# Patient Record
Sex: Female | Born: 1975 | Race: White | Hispanic: No | Marital: Married | Smoking: Former smoker
Health system: Southern US, Community
[De-identification: ages and names within clinical notes are randomized; demographics above are authoritative.]

## PROBLEM LIST (undated history)

## (undated) DIAGNOSIS — E785 Hyperlipidemia, unspecified: Secondary | ICD-10-CM

## (undated) DIAGNOSIS — E079 Disorder of thyroid, unspecified: Secondary | ICD-10-CM

## (undated) DIAGNOSIS — I10 Essential (primary) hypertension: Secondary | ICD-10-CM

## (undated) DIAGNOSIS — J45909 Unspecified asthma, uncomplicated: Secondary | ICD-10-CM

## (undated) DIAGNOSIS — J020 Streptococcal pharyngitis: Secondary | ICD-10-CM

## (undated) HISTORY — DX: Hyperlipidemia, unspecified: E78.5

## (undated) HISTORY — PX: TUBAL LIGATION: SHX77

## (undated) HISTORY — PX: TONSILLECTOMY: SUR1361

## (undated) HISTORY — PX: BREAST CYST ASPIRATION: SHX578

---

## 2006-06-30 ENCOUNTER — Ambulatory Visit: Payer: Self-pay | Admitting: Internal Medicine

## 2006-07-07 ENCOUNTER — Ambulatory Visit: Payer: Self-pay | Admitting: Internal Medicine

## 2007-02-18 ENCOUNTER — Ambulatory Visit: Payer: Self-pay | Admitting: Obstetrics and Gynecology

## 2007-02-18 ENCOUNTER — Other Ambulatory Visit: Payer: Self-pay

## 2007-02-26 ENCOUNTER — Ambulatory Visit: Payer: Self-pay | Admitting: Obstetrics and Gynecology

## 2008-05-26 ENCOUNTER — Ambulatory Visit: Payer: Self-pay | Admitting: Internal Medicine

## 2010-02-28 ENCOUNTER — Ambulatory Visit: Payer: Self-pay | Admitting: Internal Medicine

## 2010-03-20 ENCOUNTER — Ambulatory Visit: Payer: Self-pay | Admitting: Internal Medicine

## 2011-04-23 ENCOUNTER — Ambulatory Visit: Payer: Self-pay | Admitting: Internal Medicine

## 2012-04-16 ENCOUNTER — Ambulatory Visit: Payer: Self-pay | Admitting: Internal Medicine

## 2012-04-27 ENCOUNTER — Ambulatory Visit: Payer: Self-pay | Admitting: Internal Medicine

## 2012-07-03 ENCOUNTER — Emergency Department (HOSPITAL_COMMUNITY)
Admission: EM | Admit: 2012-07-03 | Discharge: 2012-07-03 | Disposition: A | Discharge status: Home / Self Care | Payer: 59 | Source: Home / Self Care | Attending: Emergency Medicine | Admitting: Emergency Medicine

## 2012-07-03 ENCOUNTER — Encounter (HOSPITAL_COMMUNITY): Payer: Self-pay | Admitting: *Deleted

## 2012-07-03 DIAGNOSIS — J329 Chronic sinusitis, unspecified: Secondary | ICD-10-CM

## 2012-07-03 HISTORY — DX: Essential (primary) hypertension: I10

## 2012-07-03 HISTORY — DX: Streptococcal pharyngitis: J02.0

## 2012-07-03 HISTORY — DX: Unspecified asthma, uncomplicated: J45.909

## 2012-07-03 HISTORY — DX: Disorder of thyroid, unspecified: E07.9

## 2012-07-03 MED ORDER — FLUCONAZOLE 150 MG PO TABS: ORAL_TABLET | ORAL | Status: AC

## 2012-07-03 MED ORDER — AMOXICILLIN-POT CLAVULANATE 500-125 MG PO TABS: 1.0000 | ORAL_TABLET | Freq: Two times a day (BID) | ORAL | Status: AC

## 2012-07-03 MED ORDER — FEXOFENADINE-PSEUDOEPHED ER 60-120 MG PO TB12: 1.0000 | ORAL_TABLET | Freq: Two times a day (BID) | ORAL | Status: AC

## 2012-07-03 NOTE — ED Notes (Signed)
C/O left sinus pain x 1.5 wks with general malaise; was out of work x 2 days.  C/O continued HA - left side > right side x 5-6 days, and now has sore throat.  Felt feverish with chills this past week.  Has been taking decongestants, Aleve with some minimal relief, and using Neti Pot.

## 2012-07-03 NOTE — ED Provider Notes (Signed)
History     CSN: [id number]  Arrival date & time 07/03/12  1129   First MD Initiated Contact with Patient 07/03/12 1140      Chief Complaint  Patient presents with  . Sore Throat    (Consider location/radiation/quality/duration/timing/severity/associated sxs/prior treatment) HPI Comments: Patient presents urgent care complaining of ongoing left sinus pain and congestion for about a week, runny nose sensation of ear occupation. No nausea vomiting or fevers she's also been complaining or expressing a sore throat as well with some fevers and chills at the beginning of last week. Patient denies any other symptoms such as visual changes, tinnitus, vertigo or paresthesias of her face or upper or lower extremities. She has been trying with some over-the-counter medicine and takes an anti-allergenic medicine chronically.  The history is provided by the patient.    Past Medical History  Diagnosis Date  . Strep pharyngitis   . Thyroid disease     Hypothyroidism  . Hypertension   . Asthma     Past Surgical History  Procedure Date  . Tonsillectomy   . Tubal ligation     No family history on file.  History  Substance Use Topics  . Smoking status: Former Smoker    Quit date: 06/03/2012  . Smokeless tobacco: Not on file  . Alcohol Use: Yes    OB History    Grav Para Term Preterm Abortions TAB SAB Ect Mult Living                  Review of Systems  Constitutional: Negative for activity change and appetite change.  HENT: Positive for congestion, rhinorrhea, postnasal drip and sinus pressure. Negative for sore throat, drooling, mouth sores, trouble swallowing, neck pain, neck stiffness, dental problem and voice change.   Eyes: Negative for pain, discharge, itching and visual disturbance.  Respiratory: Negative for cough and shortness of breath.   Cardiovascular: Negative for chest pain.  Skin: Negative for color change and rash.  Neurological: Negative for dizziness and  weakness.    Allergies  Review of patient's allergies indicates no known allergies.  Home Medications   Current Outpatient Rx  Name Route Sig Dispense Refill  . ALBUTEROL IN Inhalation Inhale into the lungs as needed.    . ASPIRIN 81 MG PO TABS Oral Take 81 mg by mouth daily.    Marland Kitchen FLONASE NA Nasal Place into the nose.    Marland Kitchen HYDROCHLOROTHIAZIDE PO Oral Take by mouth 2 (two) times daily.    Marland Kitchen XYZAL PO Oral Take by mouth.    Marland Kitchen LEVOTHYROXINE SODIUM 50 MCG PO TABS Oral Take 50 mcg by mouth daily.    . TOPROL XL PO Oral Take by mouth 2 (two) times daily.    Marland Kitchen POTASSIUM CHLORIDE PO Oral Take by mouth 2 (two) times daily.    . AMBIEN PO Oral Take by mouth.    . AMOXICILLIN-POT CLAVULANATE 500-125 MG PO TABS Oral Take 1 tablet (500 mg total) by mouth 2 (two) times daily. 20 tablet 0  . FEXOFENADINE-PSEUDOEPHED ER 60-120 MG PO TB12 Oral Take 1 tablet by mouth every 12 (twelve) hours. 14 tablet 0  . FLUCONAZOLE 150 MG PO TABS  1 tab po daily x 3 days 3 tablet 0    BP 146/95  Pulse 67  Temp 98.6 F (37 C) (Oral)  Resp 17  SpO2 98%  LMP 06/16/2012  Physical Exam  Nursing note and vitals reviewed. Constitutional: Vital signs are normal. She appears well-developed and  well-nourished.  HENT:  Head: Normocephalic.  Right Ear: Tympanic membrane normal.  Left Ear: Tympanic membrane normal.  Mouth/Throat: Uvula is midline and mucous membranes are normal. Posterior oropharyngeal erythema present.  Eyes: Conjunctivae are normal.  Neck: Neck supple. No JVD present.  Pulmonary/Chest: Effort normal and breath sounds normal. No accessory muscle usage. Not tachypneic. No respiratory distress. She has no decreased breath sounds. She has no wheezes. She has no rhonchi. She has no rales.  Abdominal: She exhibits no distension. There is no tenderness.  Musculoskeletal: Normal range of motion.  Lymphadenopathy:    She has no cervical adenopathy.  Neurological: She is alert.  Skin: Skin is warm. No  rash noted. No erythema.    ED Course  Procedures (including critical care time)   Labs Reviewed  POCT RAPID STREP A ([facility] ONLY)   No results found.   1. Sinusitis       MDM  Patient symptomatic for approximately 12 days exam and symptoms consistent with sinusitis. Patient was prescribed a course of Allegra D. along with Augmentin. She was afebrile with no focal neurological abnormalities on exam. We discussed symptoms that would warrant followup for further evaluation here or with her primary care Dr.         Jimmie Molly, MD 07/03/12 [phone]

## 2012-12-22 ENCOUNTER — Encounter: Payer: Self-pay | Admitting: Family Medicine

## 2012-12-22 ENCOUNTER — Ambulatory Visit (INDEPENDENT_AMBULATORY_CARE_PROVIDER_SITE_OTHER): Payer: 59 | Admitting: Family Medicine

## 2012-12-22 VITALS — BP 142/99 | HR 72 | Ht 65.0 in | Wt 198.0 lb

## 2012-12-22 DIAGNOSIS — M25519 Pain in unspecified shoulder: Secondary | ICD-10-CM

## 2012-12-22 DIAGNOSIS — M25511 Pain in right shoulder: Secondary | ICD-10-CM | POA: Insufficient documentation

## 2012-12-22 NOTE — Patient Instructions (Addendum)
You have strained your right rotator cuff leading to impingement, tendinopathy. Try to avoid painful activities (overhead activities, lifting with extended arm) as much as possible. Aleve 2 tabs twice a day with food OR ibuprofen 3 tabs three times a day with food for pain and inflammation. Subacromial injection may be beneficial to help with pain and to decrease inflammation - you were given this today. Start physical therapy with transition to home exercise program. Do home exercise program with theraband and scapular stabilization exercises daily - these are very important for long term relief even if an injection was given. If not improving at follow-up we will consider further imaging, nitro patches. Follow up with me in 5-6 weeks.

## 2012-12-22 NOTE — Progress Notes (Signed)
Subjective:    Patient ID: Debbie Kim, female    DOB: May 22, 1976, 37 y.o.   MRN: [id number]  PCP: Bethann Punches  HPI 37 yo F here for right shoulder pain.  Patient reports around 6 weeks ago she was putting up [employer], moving boxes when she believes this started. Also had an episode at that time where she slipped on ice and caught herself with railing with right arm. Pain has slowly worsened over this time. + night pain. Pain worse with reaching. Affecting her motion at times. No swelling or bruising. Is right handed. Taking naproxen. Occasional locking feeling.  Past Medical History  Diagnosis Date  . Strep pharyngitis   . Thyroid disease     Hypothyroidism  . Hypertension   . Asthma     Current Outpatient Prescriptions on File Prior to Visit  Medication Sig Dispense Refill  . ALBUTEROL IN Inhale into the lungs as needed.      . Fluticasone Propionate (FLONASE NA) Place into the nose.      Marland Kitchen HYDROCHLOROTHIAZIDE PO Take by mouth 2 (two) times daily.      . Levocetirizine Dihydrochloride (XYZAL PO) Take by mouth.      . levothyroxine (SYNTHROID) 50 MCG tablet Take 50 mcg by mouth daily.      . Metoprolol Succinate (TOPROL XL PO) Take by mouth 2 (two) times daily.      Marland Kitchen POTASSIUM CHLORIDE PO Take by mouth 2 (two) times daily.      Marland Kitchen sulfaSALAzine (AZULFIDINE) 500 MG tablet Take 500 mg by mouth 4 (four) times daily.      . Zolpidem Tartrate (AMBIEN PO) Take by mouth.      Marland Kitchen aspirin 81 MG tablet Take 81 mg by mouth daily.      . fexofenadine-pseudoephedrine (ALLEGRA-D) 60-120 MG per tablet Take 1 tablet by mouth every 12 (twelve) hours.  14 tablet  0    Past Surgical History  Procedure Date  . Tonsillectomy   . Tubal ligation     No Known Allergies  History   Social History  . Marital Status: Unknown    Spouse Name: N/A    Number of Children: N/A  . Years of Education: N/A   Occupational History  . Not on file.   Social History Main Topics    . Smoking status: Former Smoker    Quit date: 06/03/2012  . Smokeless tobacco: Not on file  . Alcohol Use: Yes  . Drug Use: No  . Sexually Active: Not on file   Other Topics Concern  . Not on file   Social History Narrative  . No narrative on file    Family History  Problem Relation Age of Onset  . Hypertension Mother   . Diabetes Mother   . Hypertension Father   . Hypertension Sister   . Hyperlipidemia Brother   . Heart attack Neg Hx   . Sudden death Neg Hx     BP 142/99  Pulse 72  Ht 5\' 5"  (1.651 m)  Wt 198 lb (89.812 kg)  BMI 32.95 kg/m2  Review of Systems See HPI above.    Objective:   Physical Exam Gen: NAD  R shoulder: No swelling, ecchymoses.  No gross deformity. No TTP AC joint though mild TTP biceps tendon. FROM without painful arc. Positive Hawkins, Neers. Negative Speeds, Yergasons. Strength 5/5 with empty can and resisted internal/external rotation.  Pain with resisted ER. Negative apprehension. NV intact distally.    Assessment &  Plan:  1. Right shoulder pain - 2/2 rotator cuff strain.  Excellent motion and strength reassuring.  Start with formal PT, continue with nsaids.  Subacromial injection given today.  Avoid overhead activities, reaching as much as possible.  F/u in 5-6 weeks for reevaluation.  After informed written consent, patient was seated on exam table. Right shoulder was prepped with alcohol swab and utilizing posterior approach, patient's right subacromial space was injected with 3:1 marcaine: depomedrol. Patient tolerated the procedure well without immediate complications.

## 2012-12-22 NOTE — Assessment & Plan Note (Signed)
2/2 rotator cuff strain.  Excellent motion and strength reassuring.  Start with formal PT, continue with nsaids.  Subacromial injection given today.  Avoid overhead activities, reaching as much as possible.  F/u in 5-6 weeks for reevaluation.  After informed written consent, patient was seated on exam table. Right shoulder was prepped with alcohol swab and utilizing posterior approach, patient's right subacromial space was injected with 3:1 marcaine: depomedrol. Patient tolerated the procedure well without immediate complications.

## 2012-12-27 ENCOUNTER — Ambulatory Visit: Payer: 59 | Admitting: Family Medicine

## 2012-12-29 ENCOUNTER — Ambulatory Visit: Payer: 59 | Attending: Family Medicine | Admitting: Rehabilitative and Restorative Service Providers"

## 2012-12-29 DIAGNOSIS — M25579 Pain in unspecified ankle and joints of unspecified foot: Secondary | ICD-10-CM | POA: Insufficient documentation

## 2012-12-29 DIAGNOSIS — M6281 Muscle weakness (generalized): Secondary | ICD-10-CM | POA: Insufficient documentation

## 2012-12-29 DIAGNOSIS — IMO0001 Reserved for inherently not codable concepts without codable children: Secondary | ICD-10-CM | POA: Insufficient documentation

## 2013-01-03 ENCOUNTER — Ambulatory Visit: Payer: 59 | Admitting: Physical Therapy

## 2013-01-11 ENCOUNTER — Ambulatory Visit: Payer: 59 | Admitting: Rehabilitative and Restorative Service Providers"

## 2013-01-13 ENCOUNTER — Ambulatory Visit: Payer: 59 | Admitting: Physical Therapy

## 2013-02-01 ENCOUNTER — Ambulatory Visit (INDEPENDENT_AMBULATORY_CARE_PROVIDER_SITE_OTHER): Payer: 59 | Admitting: Family Medicine

## 2013-02-01 VITALS — BP 130/94

## 2013-02-01 DIAGNOSIS — M25511 Pain in right shoulder: Secondary | ICD-10-CM

## 2013-02-01 DIAGNOSIS — M25519 Pain in unspecified shoulder: Secondary | ICD-10-CM

## 2013-02-02 ENCOUNTER — Encounter: Payer: Self-pay | Admitting: Family Medicine

## 2013-02-02 NOTE — Progress Notes (Signed)
Subjective:    Patient ID: Debbie Kim, female    DOB: 08-17-1976, 37 y.o.   MRN: [id number]  PCP: Bethann Punches  Shoulder Pain    37 yo F here for f/u right shoulder pain.  1/8: Patient reports around 6 weeks ago she was putting up [employer], moving boxes when she believes this started. Also had an episode at that time where she slipped on ice and caught herself with railing with right arm. Pain has slowly worsened over this time. + night pain. Pain worse with reaching. Affecting her motion at times. No swelling or bruising. Is right handed. Taking naproxen. Occasional locking feeling.  2/18: Patient reports her right shoulder is over 75% better following PT, home exercises, subacromial injection. Gets occasional feeling of pain with arm extended behind her or carrying something heavy with arm extended. No night pain. Not taking any pain medicine currently. Happy with her progress.  Past Medical History  Diagnosis Date  . Strep pharyngitis   . Thyroid disease     Hypothyroidism  . Hypertension   . Asthma     Current Outpatient Prescriptions on File Prior to Visit  Medication Sig Dispense Refill  . ALBUTEROL IN Inhale into the lungs as needed.      Marland Kitchen aspirin 81 MG tablet Take 81 mg by mouth daily.      . fexofenadine-pseudoephedrine (ALLEGRA-D) 60-120 MG per tablet Take 1 tablet by mouth every 12 (twelve) hours.  14 tablet  0  . Fluticasone Propionate (FLONASE NA) Place into the nose.      Marland Kitchen HYDROCHLOROTHIAZIDE PO Take by mouth 2 (two) times daily.      . Levocetirizine Dihydrochloride (XYZAL PO) Take by mouth.      . levothyroxine (SYNTHROID) 50 MCG tablet Take 50 mcg by mouth daily.      . Metoprolol Succinate (TOPROL XL PO) Take by mouth 2 (two) times daily.      Marland Kitchen POTASSIUM CHLORIDE PO Take by mouth 2 (two) times daily.      Marland Kitchen sulfaSALAzine (AZULFIDINE) 500 MG tablet Take 500 mg by mouth 4 (four) times daily.      . Zolpidem Tartrate (AMBIEN PO) Take  by mouth.       No current facility-administered medications on file prior to visit.    Past Surgical History  Procedure Laterality Date  . Tonsillectomy    . Tubal ligation      No Known Allergies  History   Social History  . Marital Status: Unknown    Spouse Name: N/A    Number of Children: N/A  . Years of Education: N/A   Occupational History  . Not on file.   Social History Main Topics  . Smoking status: Former Smoker    Quit date: 06/03/2012  . Smokeless tobacco: Not on file  . Alcohol Use: Yes  . Drug Use: No  . Sexually Active: Not on file   Other Topics Concern  . Not on file   Social History Narrative  . No narrative on file    Family History  Problem Relation Age of Onset  . Hypertension Mother   . Diabetes Mother   . Hypertension Father   . Hypertension Sister   . Hyperlipidemia Brother   . Heart attack Neg Hx   . Sudden death Neg Hx     BP 130/94  Review of Systems  See HPI above.    Objective:   Physical Exam  Gen: NAD  R shoulder:  No swelling, ecchymoses.  No gross deformity. No TTP AC joint though mild TTP biceps tendon. FROM without painful arc. Equivocal hawkins, negative Neers. Negative Speeds, Yergasons. Strength 5/5 with empty can and resisted internal/external rotation.  No pain with resisted ER. Negative apprehension. NV intact distally.    Assessment & Plan:  1. Right shoulder pain - 2/2 rotator cuff strain.  Excellent improvement with injection, PT, and home exercises to date.  Advised to do home exercises now most days of the week for next 6 weeks then can discontinue if she'd completely improved.  F/u in 6 weeks or as needed.  Call with any questions or concerns.

## 2013-02-02 NOTE — Assessment & Plan Note (Signed)
2/2 rotator cuff strain.  Excellent improvement with injection, PT, and home exercises to date.  Advised to do home exercises now most days of the week for next 6 weeks then can discontinue if she'd completely improved.  F/u in 6 weeks or as needed.  Call with any questions or concerns.

## 2013-05-10 ENCOUNTER — Ambulatory Visit: Payer: Self-pay | Admitting: Internal Medicine

## 2013-10-31 ENCOUNTER — Encounter: Payer: 59 | Attending: Internal Medicine | Admitting: Dietician

## 2013-10-31 ENCOUNTER — Encounter: Payer: Self-pay | Admitting: Dietician

## 2013-10-31 VITALS — Ht 66.0 in | Wt 191.6 lb

## 2013-10-31 DIAGNOSIS — I10 Essential (primary) hypertension: Secondary | ICD-10-CM

## 2013-10-31 DIAGNOSIS — E785 Hyperlipidemia, unspecified: Secondary | ICD-10-CM

## 2013-10-31 DIAGNOSIS — E663 Overweight: Secondary | ICD-10-CM

## 2013-10-31 DIAGNOSIS — Z713 Dietary counseling and surveillance: Secondary | ICD-10-CM | POA: Insufficient documentation

## 2013-10-31 NOTE — Progress Notes (Signed)
Medical Nutrition Therapy:  Appt start time: 1000 end time:  1100.  Assessment:  Primary concerns today: Improved weight control. Pt has recently increased synthroid dosage from 50 to 75 to 100 mcg qd. Since increase to 75 mcg a few months ago, she has lost about 10 lbs, per pt self-report. Pt admits to large portions of higher fat foods, minimal physical activity.  Preferred Learning Style:   No preference indicated   Learning Readiness:   Contemplating  MEDICATIONS: see list.   DIETARY INTAKE: Usual eating pattern includes 3 meals and 0 snacks per day. Everyday foods include sandwich in am, microwave meal for lunch.  Avoided foods include game meats, most whole grains.    24-hr recall:  B ( AM): husband makes sausage or bacon, egg, cheese on sandwich (white bread). With diet coke. If still hungry at work, will get a hash brown from cafeteria. Snk ( AM): none  L ( PM): frozen meal (lean cuisine, stouffers, etc.). Diet coke. Snk ( PM): none D ( PM): husband makes dinner. Always includes a protein, starch, and veg. Husband is diabetic, so rare pasta or potatoes. Proteins include beef, chix, pork, pt likes fish but husband rarely buys and prepares it. Starches include mashed potatoes with gravy, white rice, white bread. Veg include green beans, asparagus, salad (large amounts of ranch dressing), occasionally corn or October beans.  Snk ( PM): none Beverages: good amount of water drunk with sugar free flavoring added (about 9-10 per day). Diet soda with breakfast and lunch. No EtOH. Pt states distaste for most sweets, but enjoyment of large portions of fattier meats, cheeses, high fat, salty snacks. Pt also dislikes most if not all whole grain foods.   Usual physical activity: almost none. No active hobbies noted. She used to run, but discontinued due to some knee pain and never began again.   Progress Towards Goal(s):  In progress.   Nutritional Diagnosis:  [place]-3.3 Overweight/obesity As  related to high kcal foods, particularly fatty protein choices, sedentary lifestyle.  As evidenced by BMI>25, pt diet recall.    Intervention:  Nutrition counseling provided strategies for calorie control, increasing physical activity. RD demonstrated the Plate Method for dinner meals, recommended leaner protein options, and low kcal microwave meals for lunch. RD recommended 1400-1500 kcal per day meal plan to be tracked using smart phone, and minimum 30 minutes activity each day. Pt will take breaks from work every 2 hours to take a 10 minutes walk, to total 30 minutes each day. Because pt lacks fish in diet, RD recommended 3 g per day fish oil for reducing BP, LDL, and total cholesterol.   Teaching Method Utilized: Visual Auditory  Handouts given during visit include:  Best Pro, Fat, and CHO foods for heart health  Barriers to learning/adherence to lifestyle change: husband does most of cooking, shopping and reportedly does not alter much based on pt input. Portion control will be essential to effective weight management for this patient.  Demonstrated degree of understanding via:  Teach Back   Monitoring/Evaluation:  Dietary intake, exercise, portion control, and body weight in 2 month(s).

## 2014-01-06 ENCOUNTER — Ambulatory Visit: Payer: 59 | Admitting: Dietician

## 2014-05-02 ENCOUNTER — Ambulatory Visit (INDEPENDENT_AMBULATORY_CARE_PROVIDER_SITE_OTHER): Payer: 59 | Admitting: Family Medicine

## 2014-05-02 ENCOUNTER — Encounter: Payer: Self-pay | Admitting: Family Medicine

## 2014-05-02 ENCOUNTER — Ambulatory Visit (HOSPITAL_BASED_OUTPATIENT_CLINIC_OR_DEPARTMENT_OTHER)
Admission: RE | Admit: 2014-05-02 | Discharge: 2014-05-02 | Disposition: A | Discharge status: Home / Self Care | Payer: 59 | Source: Ambulatory Visit | Attending: Family Medicine | Admitting: Family Medicine

## 2014-05-02 VITALS — BP 143/95 | HR 85 | Ht 66.0 in | Wt 209.0 lb

## 2014-05-02 DIAGNOSIS — S99919A Unspecified injury of unspecified ankle, initial encounter: Principal | ICD-10-CM

## 2014-05-02 DIAGNOSIS — S8992XA Unspecified injury of left lower leg, initial encounter: Secondary | ICD-10-CM

## 2014-05-02 DIAGNOSIS — S8990XA Unspecified injury of unspecified lower leg, initial encounter: Secondary | ICD-10-CM

## 2014-05-02 DIAGNOSIS — S99929A Unspecified injury of unspecified foot, initial encounter: Secondary | ICD-10-CM

## 2014-05-02 DIAGNOSIS — X500XXA Overexertion from strenuous movement or load, initial encounter: Secondary | ICD-10-CM | POA: Insufficient documentation

## 2014-05-02 NOTE — Patient Instructions (Addendum)
I'm concerned you may have torn your medial meniscus vs have a meniscal contusion. Both are treated similarly initially. Icing 15 minutes at a time 3-4 times a day. Knee brace for support when up and walking around. Aleve 2 tabs twice a day with food OR ibuprofen 600mg  three times a day with food for pain and inflammation. Consider physical therapy for quad and hamstring strengthening, meniscus tear protocol. Straight leg raises, knee extensions 3 sets of 10 once a day. Follow up with me in 4 weeks for reevaluation.

## 2014-05-03 ENCOUNTER — Encounter: Payer: Self-pay | Admitting: Family Medicine

## 2014-05-03 DIAGNOSIS — S8992XA Unspecified injury of left lower leg, initial encounter: Secondary | ICD-10-CM | POA: Insufficient documentation

## 2014-05-03 NOTE — Progress Notes (Signed)
Patient ID: Debbie Kim, female   DOB: 01/30/1976, 38 y.o.   MRN: [id number]  PCP: Danella PentonMILLER,MARK F., MD  Subjective:   HPI: Patient is a 38 y.o. female here for left knee injury.  Patient reports 5 days ago on 5/14 she had left foot planted and felt like her left knee rolled. She caught herself but developed pain on medial aspect of knee below the kneecap. Used knee sleeve, icing, elevation. No bruising. Pain currently a 2/10. No catching, locking, giving out.  Past Medical History  Diagnosis Date  . Strep pharyngitis   . Thyroid disease     Hypothyroidism  . Hypertension   . Asthma   . Hyperlipidemia     Current Outpatient Prescriptions on File Prior to Visit  Medication Sig Dispense Refill  . ALBUTEROL IN Inhale into the lungs as needed.      Marland Kitchen. aspirin 81 MG tablet Take 81 mg by mouth daily.      . Fluticasone Propionate (FLONASE NA) Place into the nose.      Marland Kitchen. HYDROCHLOROTHIAZIDE PO Take by mouth 2 (two) times daily.      . Levocetirizine Dihydrochloride (XYZAL PO) Take by mouth.      . levothyroxine (SYNTHROID) 50 MCG tablet Take 100 mcg by mouth daily.       . Metoprolol Succinate (TOPROL XL PO) Take by mouth 2 (two) times daily.      Marland Kitchen. POTASSIUM CHLORIDE PO Take by mouth 2 (two) times daily.      Marland Kitchen. sulfaSALAzine (AZULFIDINE) 500 MG tablet Take 1,000 mg by mouth 2 (two) times daily.       . Zolpidem Tartrate (AMBIEN PO) Take by mouth.       No current facility-administered medications on file prior to visit.    Past Surgical History  Procedure Laterality Date  . Tonsillectomy    . Tubal ligation      No Known Allergies  History   Social History  . Marital Status: Unknown    Spouse Name: N/A    Number of Children: N/A  . Years of Education: N/A   Occupational History  . Not on file.   Social History Main Topics  . Smoking status: Former Smoker    Quit date: 06/03/2012  . Smokeless tobacco: Never Used  . Alcohol Use: Yes  . Drug Use: No  . Sexual  Activity: Not on file   Other Topics Concern  . Not on file   Social History Narrative  . No narrative on file    Family History  Problem Relation Age of Onset  . Hypertension Mother   . Diabetes Mother   . Hypertension Father   . Hypertension Sister   . Hyperlipidemia Brother   . Heart attack Neg Hx   . Sudden death Neg Hx     BP 143/95  Pulse 85  Ht 5\' 6"  (1.676 m)  Wt 209 lb (94.802 kg)  BMI 33.75 kg/m2  LMP 04/14/2014  Review of Systems: See HPI above.    Objective:  Physical Exam:  Gen: NAD  Left knee: No gross deformity, ecchymoses, swelling. Medial joint line TTP.  No lateral joint line, other TTP. FROM. Negative ant/post drawers. Negative valgus/varus testing. Negative lachmanns. Positive mcmurrays, apleys, thessalys.  Negative patellar apprehension. NV intact distally.    Assessment & Plan:  1. Left knee injury - concerning for medial meniscal tear vs contusion.  Start with conservative treatment as she's not having mechanical symptoms.  Icing, brace,  nsaids, home strenghtening exercises.  Consider PT, imaging if not improving.

## 2014-05-03 NOTE — Assessment & Plan Note (Signed)
concerning for medial meniscal tear vs contusion.  Start with conservative treatment as she's not having mechanical symptoms.  Icing, brace, nsaids, home strenghtening exercises.  Consider PT, imaging if not improving.

## 2014-05-30 ENCOUNTER — Encounter: Payer: Self-pay | Admitting: Family Medicine

## 2014-05-30 ENCOUNTER — Ambulatory Visit (INDEPENDENT_AMBULATORY_CARE_PROVIDER_SITE_OTHER): Payer: 59 | Admitting: Family Medicine

## 2014-05-30 VITALS — BP 130/88 | HR 82 | Ht 66.0 in | Wt 210.0 lb

## 2014-05-30 DIAGNOSIS — S99919A Unspecified injury of unspecified ankle, initial encounter: Secondary | ICD-10-CM

## 2014-05-30 DIAGNOSIS — S99929A Unspecified injury of unspecified foot, initial encounter: Secondary | ICD-10-CM

## 2014-05-30 DIAGNOSIS — S8990XA Unspecified injury of unspecified lower leg, initial encounter: Secondary | ICD-10-CM

## 2014-05-30 DIAGNOSIS — S8992XA Unspecified injury of left lower leg, initial encounter: Secondary | ICD-10-CM

## 2014-05-30 MED ORDER — METHYLPREDNISOLONE ACETATE 40 MG/ML IJ SUSP
40.0000 mg | Freq: Once | INTRAMUSCULAR | Status: AC
  Administered 2014-05-30: 40 mg via INTRA_ARTICULAR

## 2014-05-30 NOTE — Patient Instructions (Signed)
Call me if over 1-2 weeks you're still not improving as expected. If that's the case, would consider an MRI. No restrictions on activities after this shot.

## 2014-05-31 ENCOUNTER — Encounter: Payer: Self-pay | Admitting: Family Medicine

## 2014-05-31 ENCOUNTER — Ambulatory Visit: Payer: Self-pay | Admitting: Internal Medicine

## 2014-05-31 NOTE — Progress Notes (Signed)
Patient ID: Debbie Kim, female   DOB: 11/27/1976, 38 y.o.   MRN: [id number]  PCP: Danella PentonMILLER,MARK F., MD  Subjective:   HPI: Patient is a 38 y.o. female here for left knee injury.  5/19: Patient reports 5 days ago on 5/14 she had left foot planted and felt like her left knee rolled. She caught herself but developed pain on medial aspect of knee below the kneecap. Used knee sleeve, icing, elevation. No bruising. Pain currently a 2/10. No catching, locking, giving out.  6/16: Patient reports left knee feels worse instead of improved. Pain now a 4/10. Swelling has improved. Did more walking than usual last week in [place]. Icing, using sleeve, elevating. No catching, locking, giving out.  Past Medical History  Diagnosis Date  . Strep pharyngitis   . Thyroid disease     Hypothyroidism  . Hypertension   . Asthma   . Hyperlipidemia     Current Outpatient Prescriptions on File Prior to Visit  Medication Sig Dispense Refill  . ALBUTEROL IN Inhale into the lungs as needed.      Marland Kitchen. aspirin 81 MG tablet Take 81 mg by mouth daily.      . Fluticasone Propionate (FLONASE NA) Place into the nose.      Marland Kitchen. HYDROCHLOROTHIAZIDE PO Take by mouth 2 (two) times daily.      . Levocetirizine Dihydrochloride (XYZAL PO) Take by mouth.      . levothyroxine (SYNTHROID) 50 MCG tablet Take 100 mcg by mouth daily.       . Metoprolol Succinate (TOPROL XL PO) Take by mouth 2 (two) times daily.      Marland Kitchen. POTASSIUM CHLORIDE PO Take by mouth 2 (two) times daily.      Marland Kitchen. sulfaSALAzine (AZULFIDINE) 500 MG tablet Take 1,000 mg by mouth 2 (two) times daily.       . Zolpidem Tartrate (AMBIEN PO) Take by mouth.       No current facility-administered medications on file prior to visit.    Past Surgical History  Procedure Laterality Date  . Tonsillectomy    . Tubal ligation      No Known Allergies  History   Social History  . Marital Status: Unknown    Spouse Name: N/A    Number of Children: N/A  .  Years of Education: N/A   Occupational History  . Not on file.   Social History Main Topics  . Smoking status: Former Smoker    Quit date: 06/03/2012  . Smokeless tobacco: Never Used  . Alcohol Use: Yes  . Drug Use: No  . Sexual Activity: Not on file   Other Topics Concern  . Not on file   Social History Narrative  . No narrative on file    Family History  Problem Relation Age of Onset  . Hypertension Mother   . Diabetes Mother   . Hypertension Father   . Hypertension Sister   . Hyperlipidemia Brother   . Heart attack Neg Hx   . Sudden death Neg Hx     BP 130/88  Pulse 82  Ht 5\' 6"  (1.676 m)  Wt 210 lb (95.255 kg)  BMI 33.91 kg/m2  LMP 04/14/2014  Review of Systems: See HPI above.    Objective:  Physical Exam:  Gen: NAD  Left knee: No gross deformity, ecchymoses, swelling. Medial joint line TTP.  No lateral joint line, other TTP. FROM. Negative ant/post drawers. Negative valgus/varus testing. Negative lachmanns. Positive mcmurrays, apleys.  Negative patellar apprehension.  NV intact distally.    Assessment & Plan:  1. Left knee injury - concerning for medial meniscal tear.  Not improving following last visit with conservative care (HEP, icing, sleeve, nsaids).  Discussed options and will go ahead with injection today.  Discussed considering PT, further imaging as next steps (likely the latter) if not improving over next couple weeks.  After informed written consent, patient was seated on exam table. Left knee was prepped with alcohol swab and utilizing anteromedial approach, patient's left knee was injected intraarticularly with 3:1 marcaine: depomedrol. Patient tolerated the procedure well without immediate complications.

## 2014-05-31 NOTE — Assessment & Plan Note (Signed)
concerning for medial meniscal tear.  Not improving following last visit with conservative care (HEP, icing, sleeve, nsaids).  Discussed options and will go ahead with injection today.  Discussed considering PT, further imaging as next steps (likely the latter) if not improving over next couple weeks.  After informed written consent, patient was seated on exam table. Left knee was prepped with alcohol swab and utilizing anteromedial approach, patient's left knee was injected intraarticularly with 3:1 marcaine: depomedrol. Patient tolerated the procedure well without immediate complications.

## 2014-11-14 ENCOUNTER — Encounter: Payer: 59 | Attending: Internal Medicine | Admitting: *Deleted

## 2014-11-14 ENCOUNTER — Encounter: Payer: Self-pay | Admitting: *Deleted

## 2014-11-14 DIAGNOSIS — E663 Overweight: Secondary | ICD-10-CM | POA: Diagnosis present

## 2014-11-14 DIAGNOSIS — Z6833 Body mass index (BMI) 33.0-33.9, adult: Secondary | ICD-10-CM | POA: Insufficient documentation

## 2014-11-14 DIAGNOSIS — Z713 Dietary counseling and surveillance: Secondary | ICD-10-CM | POA: Diagnosis not present

## 2014-11-14 NOTE — Progress Notes (Signed)
Medical Nutrition Therapy:  Appt start time: 0900 end time:  0945.   Assessment:  Primary concerns today: Debbie Kim is here for nutrition counseling.  She is a [employer] and referred herself a year ago.  She met with Kevan Mellendick, RD, at that time, but she admits she wasn't ready to make any changes at that point.  She has referred herself back to our department and she is already in the process of making lifestyle changes:  Stopped drinking alcohol altogether.  She is trying to focus more on what she's eating and has tried to switch from high fat foods (meats, nuts, cheese) and eat 5 smaller meals and increase vegetables. She feels like she has gained weight.  Husband has diabetes and is home-bound.  They eat out less and Debbie Kim cooks more at home.  She is conflicted on how to cook for his needs verses his needs (she wants higher carb foods and less protein.  She has cut back on prepackaged foods.   Husband went to classes at [facility] several years ago for diabetes education, but he has forgotten most of it.  Debbie Kim hasn't had formal eduction. Usually eats in front of the tv in the den.  She thinks she is a very fast eater.    Preferred Learning Style:   No preference indicated   Learning Readiness:   Change in progress   MEDICATIONS: see list   DIETARY INTAKE:  Usual eating pattern includes 3 meals and 0 snacks per day.  Everyday foods include proteins, starches, fruits, and vegetables.  Avoided foods include none.    24-hr recall:  B ( AM): bacon, egg, and cheese sandwich; bagel with cream cheese and lox.  Might eat at desk at work  Snk ( AM): none  L ( PM): might skip on weekends.   Eats at [facility] hospital cafeteria:: 2 vegetable and meat; might bring chicken salad sandwich from home with fruit and small serving of chips Snk ( PM): none D ( PM): protein, 2 non-starchy vegetables, and 1 starch Snk ( PM): none Beverages: diet coke until noon (3), water, diet  gingerale  Usual physical activity: has bad knees, but tries to do yoga, walking, stretching 15-45 minutes 4-5 times/week  Estimated energy needs: 1800 calories 225 g carbohydrates 90 g protein 60 g fat    Nutritional Diagnosis:  NB-1.5 Disordered eating pattern As related to mindelss eating.  As evidenced by dietary recall.    Intervention:  Nutrition counseling provided.  Encouraged patient to reject traditional diet mentality of "good" vs "bad" foods.  There are no good and bad foods, but rather food is fuel that we needs for our bodies.  When we don't get enough fuel, our bodies suffer the metabolic consequences.  Encouraged patient to honor their body's internal hunger and fullness cues.  Throughout the day, check in mentally and rate hunger.  Try not to eat when ravenous, but instead when slightly hungry.  Then choose food(s) that will be satisfying regardless of nutritional content.  Sit down to enjoy those foods.  Minimize distractions: turn off tv, put away books, work, Brewing technologist.  Make the meal last at least 20 minutes in order to give time to experience and register satiety.  Stop eating when full regardless of how much food is left on the plate.  Get more if still hungry.  The key is to honor fullness so throughout the meal, rate fullness factor and stop when comfortably full, but not stuffed.  Reminded patient that they can have any food they want, whenever they want, and however much they want.  Eventually the novelty will wear out and each food will be equal in terms of its emotional appeal.  This will be a learning process and some days more food will be eaten, some days less.  The key is to honor hunger and fullness without any feelings of guilt.  Pay attention to what the internal cues are, rather than any external factors.  Also recommended exercise for its help benefits not for weight loss.  Focus on how it makes your feel afterwards and how symptoms improve, not weight loss.  Do  exercises that are fun, not torture.  Discussed MyPlate recommendations for diabetes meal planning for her husband and herself at home.  She realizes what she is doing is not that far off   Teaching Method Utilized: * Visual Auditory   Handouts given during visit include:  myplate for diabetes  Meal plan card  Barriers to learning/adherence to lifestyle change: none  Demonstrated degree of understanding via:  Teach Back   Monitoring/Evaluation:  Dietary intake, exercise, and body weight prn.

## 2015-05-02 ENCOUNTER — Other Ambulatory Visit: Payer: Self-pay | Admitting: Internal Medicine

## 2015-05-02 DIAGNOSIS — Z1231 Encounter for screening mammogram for malignant neoplasm of breast: Secondary | ICD-10-CM

## 2015-05-11 ENCOUNTER — Other Ambulatory Visit: Payer: Self-pay | Admitting: Internal Medicine

## 2015-05-11 ENCOUNTER — Ambulatory Visit
Admission: RE | Admit: 2015-05-11 | Discharge: 2015-05-11 | Disposition: A | Discharge status: Home / Self Care | Payer: 59 | Source: Ambulatory Visit | Attending: Internal Medicine | Admitting: Internal Medicine

## 2015-05-11 DIAGNOSIS — Z1231 Encounter for screening mammogram for malignant neoplasm of breast: Secondary | ICD-10-CM | POA: Insufficient documentation

## 2016-05-05 ENCOUNTER — Other Ambulatory Visit: Payer: Self-pay | Admitting: Internal Medicine

## 2016-05-05 DIAGNOSIS — Z1231 Encounter for screening mammogram for malignant neoplasm of breast: Secondary | ICD-10-CM

## 2016-05-26 ENCOUNTER — Ambulatory Visit
Admission: RE | Admit: 2016-05-26 | Discharge: 2016-05-26 | Disposition: A | Discharge status: Home / Self Care | Payer: BLUE CROSS/BLUE SHIELD | Source: Ambulatory Visit | Attending: Internal Medicine | Admitting: Internal Medicine

## 2016-05-26 ENCOUNTER — Other Ambulatory Visit: Payer: Self-pay | Admitting: Internal Medicine

## 2016-05-26 DIAGNOSIS — Z1231 Encounter for screening mammogram for malignant neoplasm of breast: Secondary | ICD-10-CM | POA: Diagnosis not present

## 2017-04-23 ENCOUNTER — Other Ambulatory Visit (HOSPITAL_COMMUNITY): Payer: Self-pay | Admitting: Internal Medicine

## 2017-04-23 ENCOUNTER — Other Ambulatory Visit: Payer: Self-pay | Admitting: Internal Medicine

## 2017-04-23 DIAGNOSIS — Z1231 Encounter for screening mammogram for malignant neoplasm of breast: Secondary | ICD-10-CM

## 2017-05-27 ENCOUNTER — Ambulatory Visit
Admission: RE | Admit: 2017-05-27 | Discharge: 2017-05-27 | Disposition: A | Discharge status: Home / Self Care | Payer: BLUE CROSS/BLUE SHIELD | Source: Ambulatory Visit | Attending: Internal Medicine | Admitting: Internal Medicine

## 2017-05-27 DIAGNOSIS — Z1231 Encounter for screening mammogram for malignant neoplasm of breast: Secondary | ICD-10-CM | POA: Diagnosis present

## 2017-05-29 ENCOUNTER — Other Ambulatory Visit: Payer: Self-pay | Admitting: Internal Medicine

## 2017-05-29 DIAGNOSIS — N6489 Other specified disorders of breast: Secondary | ICD-10-CM

## 2017-05-29 DIAGNOSIS — R928 Other abnormal and inconclusive findings on diagnostic imaging of breast: Secondary | ICD-10-CM

## 2017-06-18 ENCOUNTER — Ambulatory Visit
Admission: RE | Admit: 2017-06-18 | Discharge: 2017-06-18 | Disposition: A | Discharge status: Home / Self Care | Payer: BLUE CROSS/BLUE SHIELD | Source: Ambulatory Visit | Attending: Internal Medicine | Admitting: Internal Medicine

## 2017-06-18 DIAGNOSIS — N6489 Other specified disorders of breast: Secondary | ICD-10-CM | POA: Insufficient documentation

## 2017-06-18 DIAGNOSIS — R928 Other abnormal and inconclusive findings on diagnostic imaging of breast: Secondary | ICD-10-CM

## 2017-06-29 ENCOUNTER — Encounter (HOSPITAL_COMMUNITY): Payer: Self-pay | Admitting: Psychiatry

## 2017-06-29 ENCOUNTER — Other Ambulatory Visit (HOSPITAL_COMMUNITY): Payer: BLUE CROSS/BLUE SHIELD | Attending: Psychiatry | Admitting: Psychiatry

## 2017-06-29 DIAGNOSIS — F339 Major depressive disorder, recurrent, unspecified: Secondary | ICD-10-CM | POA: Insufficient documentation

## 2017-06-29 DIAGNOSIS — F329 Major depressive disorder, single episode, unspecified: Secondary | ICD-10-CM

## 2017-06-29 DIAGNOSIS — F411 Generalized anxiety disorder: Secondary | ICD-10-CM | POA: Insufficient documentation

## 2017-06-29 NOTE — Progress Notes (Signed)
Daily Group Progress Note  Program: IOP  06/29/2017    Group Time: 10:30 - 12 PM  Participation Level:  Active  Behavioral Response: Appropriate  Type of Therapy: Process Group  Summary of Progress: Patients had opportunity to process their feelings and behavioral responses to current life situation. Patient came into group as new patient toward last portion; was attentive and showed empathy for others.     Carney Bernatherine C Tayven Renteria, LCSW
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
## 2017-06-30 ENCOUNTER — Other Ambulatory Visit (HOSPITAL_COMMUNITY): Payer: BLUE CROSS/BLUE SHIELD | Admitting: Psychiatry

## 2017-06-30 DIAGNOSIS — F329 Major depressive disorder, single episode, unspecified: Secondary | ICD-10-CM

## 2017-06-30 DIAGNOSIS — F339 Major depressive disorder, recurrent, unspecified: Secondary | ICD-10-CM | POA: Diagnosis not present

## 2017-06-30 NOTE — Progress Notes (Signed)
Psychiatric Initial Adult Assessment   Patient Identification: Maxcine HamCarol A Mullis MRN:  [id number] Date of Evaluation:  06/30/2017 Referral Source: Dr Maryruth BunKapur Chief Complaint: anxiety and depression  Visit Diagnosis: major depression, single episode moderate.  Generalized anxiety disorder.  Alcohol use disorder  History of Present Illness:  Ms Mauri ReadingMullis has been anxious all her life to the point of panic at times.  The drinking was initially a way to decrease anxiety but over time became an addiction to the point of 3 bottles of wine daily.  No drinking for about a year having stopped on her own.  Depression seems secondary to the anxiety because she cannot escape the constant worry and feeling that something is about to happen.  The drinking overall made things worse to the point of having to quit her job, being unable to work her current job since December 2017.  She gets overwhelmed with anxiety just thinking about returning to work.  She is trying to divorce her current husband who is much older than she but he is making it hard even though they have been separated for 2 years and she has moved on to someone else.  Sleep is impaired by the constant worrying at night over nothing serious.  Associated Signs/Symptoms: Depression Symptoms:  depressed mood, insomnia, fatigue, difficulty concentrating, anxiety, (Hypo) Manic Symptoms:  Irritable Mood, Anxiety Symptoms:  Excessive Worry, Psychotic Symptoms:  none PTSD Symptoms: Negative  Past Psychiatric History: recent therapy and ongoing medication treatment  Previous Psychotropic Medications: Yes   Substance Abuse History in the last 12 months:  Yes.    Consequences of Substance Abuse: Family Consequences:  loss of marriage partially due to alcohol  Past Medical History:  Past Medical History:  Diagnosis Date  . Asthma   . Hyperlipidemia   . Hypertension   . Strep pharyngitis   . Thyroid disease    Hypothyroidism    Past Surgical  History:  Procedure Laterality Date  . BREAST CYST ASPIRATION Right    neg  . TONSILLECTOMY    . TUBAL LIGATION      Family Psychiatric History: none of current relevance  Family History:  Family History  Problem Relation Age of Onset  . Hypertension Mother   . Diabetes Mother   . Breast cancer Mother        5340's & 4970's  . Hypertension Father   . Hypertension Sister   . Hyperlipidemia Brother   . Breast cancer Maternal Aunt        40's  . Alcohol abuse Maternal Aunt   . Breast cancer Maternal Grandmother        unsure of age   . Alcohol abuse Maternal Uncle   . Alcohol abuse Maternal Grandfather   . Heart attack Neg Hx   . Sudden death Neg Hx     Social History:   Social History   Social History  . Marital status: Divorced    Spouse name: N/A  . Number of children: N/A  . Years of education: N/A   Social History Main Topics  . Smoking status: Former Smoker    Quit date: 06/03/2012  . Smokeless tobacco: Never Used  . Alcohol use Yes  . Drug use: No  . Sexual activity: Not Currently    Birth control/ protection: Surgical   Other Topics Concern  . Not on file   Social History Narrative  . No narrative on file    Additional Social History: none  Allergies:  No Known  Allergies  Metabolic Disorder Labs: No results found for: HGBA1C, MPG No results found for: PROLACTIN No results found for: CHOL, TRIG, HDL, CHOLHDL, VLDL, LDLCALC   Current Medications: Current Outpatient Prescriptions  Medication Sig Dispense Refill  . ALBUTEROL IN Inhale into the lungs as needed.    Marland Kitchen aspirin 81 MG tablet Take 81 mg by mouth daily.    . Fluticasone Propionate (FLONASE NA) Place into the nose.    Marland Kitchen HYDROCHLOROTHIAZIDE PO Take by mouth 2 (two) times daily.    . Levocetirizine Dihydrochloride (XYZAL PO) Take by mouth.    . levothyroxine (SYNTHROID) 50 MCG tablet Take 100 mcg by mouth daily.     . Metoprolol Succinate (TOPROL XL PO) Take by mouth 2 (two) times daily.     Marland Kitchen POTASSIUM CHLORIDE PO Take by mouth 2 (two) times daily.    Marland Kitchen sulfaSALAzine (AZULFIDINE) 500 MG tablet Take 1,000 mg by mouth 2 (two) times daily.     . Zolpidem Tartrate (AMBIEN PO) Take by mouth.     No current facility-administered medications for this visit.     Neurologic: Headache: no Seizure: Negative Paresthesias:Negative  Musculoskeletal: Strength & Muscle Tone: within normal limits Gait & Station: normal Patient leans: N/A  Psychiatric Specialty Exam: ROS  Last menstrual period 06/11/2017.There is no height or weight on file to calculate BMI.  General Appearance: Well Groomed  Eye Contact:  Good  Speech:  Clear and Coherent  Volume:  Normal  Mood:  Anxious  Affect:  Congruent  Thought Process:  Coherent and Goal Directed  Orientation:  Full (Time, Place, and Person)  Thought Content:  Logical  Suicidal Thoughts:  No  Homicidal Thoughts:  No  Memory:  Immediate;   Good Recent;   Good Remote;   Good  Judgement:  Good  Insight:  Good  Psychomotor Activity:  Normal  Concentration:  Concentration: Good and Attention Span: Good  Recall:  Good  Fund of Knowledge:Good  Language: Good  Akathisia:  Negative  Handed:  Right  AIMS (if indicated):  0  Assets:  Communication Skills Desire for Improvement Financial Resources/Insurance Housing Intimacy Leisure Time Physical Health Resilience Social Support Talents/Skills Transportation Vocational/Educational  ADL's:  Intact  Cognition: WNL  Sleep:  poor    Treatment Plan Summary: Admit to IOP.  Continue duloxetine 90 mg daily.  Add gabapentin 300 mg tid for anxiety  Carolanne Grumbling, MD 7/17/201812:34 PM

## 2017-06-30 NOTE — Progress Notes (Signed)
  Daily Group Progress Note 06/30/2017  Program: IOP  Group Time:  9:00 - 10:30 AM  Participation Level: Active  Behavioral Response: Appropriate, Sharing and Attentive  Type of Therapy:  Process Group  Summary of Progress: Group members were able to process their feelings about current situation and health concerns. Patient was attentive to others and shared how she related to others with support.     Group Time:  10:30 - 12 PM  Participation Level:  Active  Behavioral Response: Appropriate and Sharing  Type of Therapy: Process Group  Summary of Progress:  Group members continued to process their experiences with additional focus on Wheel of Life handout.  Many were able to recognize how their life is currently unbalanced and what ares of life they may choose to put more or less energy into. Patient shared her experience with letting go of difficult relationships, how she reached the decisions and the long term effects.   Carney Bernatherine C Harrill, LCSW

## 2017-07-01 ENCOUNTER — Other Ambulatory Visit (HOSPITAL_COMMUNITY): Payer: BLUE CROSS/BLUE SHIELD | Admitting: *Deleted

## 2017-07-01 DIAGNOSIS — F329 Major depressive disorder, single episode, unspecified: Secondary | ICD-10-CM

## 2017-07-01 DIAGNOSIS — F339 Major depressive disorder, recurrent, unspecified: Secondary | ICD-10-CM | POA: Diagnosis not present

## 2017-07-01 NOTE — Progress Notes (Signed)
  Daily Group Progress Note 07/01/2017  Program: IOP  Group Time: 9 AM - 10:30 AM  Participation Level: Active  Behavioral Response: Appropriate and Sharing  Type of Therapy:  Process Group  Summary of Progress: Patient's processed thoughts and feelings related to a quote about sharing/not sharing joys and pains. Patient participated significantly in discussion and shared experience of allowing friends to know her pain.      Group Time:  10:30 AM - 12 PM  Participation Level:  Active  Behavioral Response: Appropriate and Sharing  Type of Therapy: Psycho-education Group  Summary of Progress: Brief presentation delivered by facilitator on topic of 'self care' and what that might look like for different individuals. Patients had opportunity to share with others their experience with self care. Patient took advantage of unique opportunity to process and restate her self judgement.   Carney Bernatherine C Harrill, LCSW

## 2017-07-02 ENCOUNTER — Other Ambulatory Visit (HOSPITAL_COMMUNITY): Payer: BLUE CROSS/BLUE SHIELD | Admitting: *Deleted

## 2017-07-02 DIAGNOSIS — F329 Major depressive disorder, single episode, unspecified: Secondary | ICD-10-CM

## 2017-07-02 DIAGNOSIS — F339 Major depressive disorder, recurrent, unspecified: Secondary | ICD-10-CM | POA: Diagnosis not present

## 2017-07-02 NOTE — Progress Notes (Signed)
   Daily Group Progress Note 07/02/2017  Program: IOP  Group Time:  9:00 - 11:00 AM  Participation Level: Active  Behavioral Response: Appropriate, Sharing and Attentive  Type of Therapy:  Process Group  Summary of Progress:  Patients shared their feelings related to their current diagnosis, relationship issues and group. Patient processed her reluctance to attend group today and her feelings regarding her professional position.      Group Time:  11:00 AM - 12:00 PM  Participation Level:  Active  Behavioral Response: Appropriate  Type of Therapy: Activity Group  Summary of Progress:  Patients had opportunity to participate in yoga activity which focused on breathing and centering.   Carney Bernatherine C Holleigh Crihfield, LCSW

## 2017-07-03 ENCOUNTER — Other Ambulatory Visit (HOSPITAL_COMMUNITY): Payer: BLUE CROSS/BLUE SHIELD | Admitting: Psychiatry

## 2017-07-03 DIAGNOSIS — F329 Major depressive disorder, single episode, unspecified: Secondary | ICD-10-CM

## 2017-07-03 DIAGNOSIS — F339 Major depressive disorder, recurrent, unspecified: Secondary | ICD-10-CM | POA: Diagnosis not present
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
## 2017-07-06 ENCOUNTER — Encounter: Payer: Self-pay | Admitting: Psychiatry

## 2017-07-06 ENCOUNTER — Other Ambulatory Visit (HOSPITAL_COMMUNITY): Payer: BLUE CROSS/BLUE SHIELD | Admitting: Psychiatry

## 2017-07-06 DIAGNOSIS — F339 Major depressive disorder, recurrent, unspecified: Secondary | ICD-10-CM | POA: Diagnosis not present

## 2017-07-06 DIAGNOSIS — F329 Major depressive disorder, single episode, unspecified: Secondary | ICD-10-CM

## 2017-07-06 NOTE — Progress Notes (Signed)
    Daily Group Progress Note  Program: IOP  Group Time: 9:00-12:00  Participation Level: Active  Behavioral Response: Appropriate  Type of Therapy:  Group Therapy  Summary of Progress: Pt. Presented as talkative, engaged in group process, smiled and provided feedback to other group members appropriately. Pt. Participated in medication education group with [place]. Pt. Shared with the group that work stress is her most significant stressor. Pt. Discussed her concerns that her employer is trying to push her out and fears about retaliation due to mental health leave when she returns to work.      Shaune PollackBrown, Arneta Mahmood B, LPC

## 2017-07-07 ENCOUNTER — Other Ambulatory Visit (HOSPITAL_COMMUNITY): Payer: BLUE CROSS/BLUE SHIELD | Admitting: Psychiatry

## 2017-07-07 DIAGNOSIS — F329 Major depressive disorder, single episode, unspecified: Secondary | ICD-10-CM

## 2017-07-07 DIAGNOSIS — F339 Major depressive disorder, recurrent, unspecified: Secondary | ICD-10-CM | POA: Diagnosis not present

## 2017-07-07 NOTE — Progress Notes (Signed)
    Daily Group Progress Note  Program: IOP  Group Time: 9:00-12:00  Participation Level: Active  Behavioral Response: Appropriate  Type of Therapy:  Group Therapy  Summary of Progress: Pt. Presents as mildly anxious, talkative, engaged in the group process. Pt. Discussed her anxiety about upcoming court dates regarding her divorce. Pt. Discussed her fears of being judged publicly. Pt. Received feedback from the group regarding working through her shame about being about being in recovery from alcohol addiction and making decision to end her marriage. Pt. Participated in grief and loss group with the Chaplain.      Shaune PollackBrown, Jennifer B, LPC

## 2017-07-08 ENCOUNTER — Other Ambulatory Visit (HOSPITAL_COMMUNITY): Payer: BLUE CROSS/BLUE SHIELD

## 2017-07-09 ENCOUNTER — Other Ambulatory Visit (HOSPITAL_COMMUNITY): Payer: BLUE CROSS/BLUE SHIELD

## 2017-07-10 ENCOUNTER — Other Ambulatory Visit (HOSPITAL_COMMUNITY): Payer: BLUE CROSS/BLUE SHIELD

## 2017-07-13 ENCOUNTER — Other Ambulatory Visit (HOSPITAL_COMMUNITY): Payer: BLUE CROSS/BLUE SHIELD | Admitting: Psychiatry

## 2017-07-13 DIAGNOSIS — F329 Major depressive disorder, single episode, unspecified: Secondary | ICD-10-CM

## 2017-07-13 DIAGNOSIS — F339 Major depressive disorder, recurrent, unspecified: Secondary | ICD-10-CM | POA: Diagnosis not present
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
## 2017-07-14 ENCOUNTER — Other Ambulatory Visit (HOSPITAL_COMMUNITY): Payer: BLUE CROSS/BLUE SHIELD | Admitting: Psychiatry

## 2017-07-14 DIAGNOSIS — F329 Major depressive disorder, single episode, unspecified: Secondary | ICD-10-CM

## 2017-07-14 DIAGNOSIS — F339 Major depressive disorder, recurrent, unspecified: Secondary | ICD-10-CM | POA: Diagnosis not present

## 2017-07-14 MED ORDER — GABAPENTIN 600 MG PO TABS: 600.0000 mg | ORAL_TABLET | Freq: Three times a day (TID) | ORAL | 2 refills | Status: AC

## 2017-07-14 NOTE — Patient Instructions (Signed)
D:  Pt successfully completed MH-IOP today.  A:  Follow up with Jaquita Foldsindy Ziller, LPC on 07-16-17 @ 3 pm and Dr. Maryruth BunKapur on 07-24-17 @ 8 a.m.  Encouraged support groups.  Highly recommend the WRAP Group through [facility].  R:  Pt receptive.

## 2017-07-14 NOTE — Progress Notes (Signed)
Patient ID: Debbie Hamarol A Kim, female   DOB: 10/30/1976, 41 y.o.   [id number] [facility]  Patient:  Debbie Kim DOB:  03/26/1976  Date of Admission: 06/30/2017  Date of Discharge: 07/14/2017  Reason for Admission:depression and anxiety  IOP Course:attended and participated.  Depression improved.  Anxiety some better but still prominent  Mental Status at Discharge:no suicidal thinking  Diagnosis: major depression recurrent moderate.  Generalized anxiety disorder  Level of Care:  IOP  Discharge destination:  Has appointments with her psychiatrist and therapist   Comments:  Very capable and seemed to benefit from the group  The patient received suicide prevention pamphlet:  Yes   Carolanne GrumblingGerald Damian Hofstra, MD
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
## 2017-07-14 NOTE — Progress Notes (Signed)
    Daily Group Progress Note  Program: IOP  Group Time: 9:00-12:00  Participation Level: Active  Behavioral Response: Appropriate  Type of Therapy:  Group Therapy  Summary of Progress: Pt. Presented as talkative, engaged in the group process. Pt. Met with the case manager and psychiatrist and prepared for discharge. Pt. Received positive feedback from the group regarding her participation in the group process. Pt. Discussed her history of co-dependence and learning to live in a healthy relationship. Pt. Expressed some trepidation about returning to work, but indicated that she was ready to go back and face the challenge of being there. Pt. Participated in grief and loss group with the Chaplain.     Nancie Neas, LPC

## 2017-07-15 ENCOUNTER — Other Ambulatory Visit (HOSPITAL_COMMUNITY): Payer: BLUE CROSS/BLUE SHIELD

## 2017-07-16 ENCOUNTER — Other Ambulatory Visit (HOSPITAL_COMMUNITY): Payer: BLUE CROSS/BLUE SHIELD

## 2017-07-17 ENCOUNTER — Other Ambulatory Visit (HOSPITAL_COMMUNITY): Payer: BLUE CROSS/BLUE SHIELD

## 2017-07-20 ENCOUNTER — Other Ambulatory Visit (HOSPITAL_COMMUNITY): Payer: BLUE CROSS/BLUE SHIELD

## 2017-07-21 ENCOUNTER — Other Ambulatory Visit (HOSPITAL_COMMUNITY): Payer: BLUE CROSS/BLUE SHIELD

## 2017-07-22 ENCOUNTER — Other Ambulatory Visit (HOSPITAL_COMMUNITY): Payer: BLUE CROSS/BLUE SHIELD

## 2017-07-23 ENCOUNTER — Other Ambulatory Visit (HOSPITAL_COMMUNITY): Payer: BLUE CROSS/BLUE SHIELD

## 2017-07-24 ENCOUNTER — Other Ambulatory Visit (HOSPITAL_COMMUNITY): Payer: BLUE CROSS/BLUE SHIELD

## 2017-07-27 ENCOUNTER — Other Ambulatory Visit (HOSPITAL_COMMUNITY): Payer: BLUE CROSS/BLUE SHIELD

## 2017-07-28 ENCOUNTER — Other Ambulatory Visit (HOSPITAL_COMMUNITY): Payer: BLUE CROSS/BLUE SHIELD

## 2017-07-29 ENCOUNTER — Other Ambulatory Visit (HOSPITAL_COMMUNITY): Payer: BLUE CROSS/BLUE SHIELD

## 2017-07-30 ENCOUNTER — Other Ambulatory Visit (HOSPITAL_COMMUNITY): Payer: BLUE CROSS/BLUE SHIELD

## 2017-07-31 ENCOUNTER — Other Ambulatory Visit (HOSPITAL_COMMUNITY): Payer: BLUE CROSS/BLUE SHIELD

## 2017-08-03 ENCOUNTER — Other Ambulatory Visit (HOSPITAL_COMMUNITY): Payer: BLUE CROSS/BLUE SHIELD

## 2017-08-04 ENCOUNTER — Other Ambulatory Visit (HOSPITAL_COMMUNITY): Payer: BLUE CROSS/BLUE SHIELD

## 2017-08-05 ENCOUNTER — Other Ambulatory Visit (HOSPITAL_COMMUNITY): Payer: BLUE CROSS/BLUE SHIELD

## 2017-08-06 ENCOUNTER — Other Ambulatory Visit (HOSPITAL_COMMUNITY): Payer: BLUE CROSS/BLUE SHIELD

## 2017-08-07 ENCOUNTER — Other Ambulatory Visit (HOSPITAL_COMMUNITY): Payer: BLUE CROSS/BLUE SHIELD

## 2017-08-10 ENCOUNTER — Other Ambulatory Visit (HOSPITAL_COMMUNITY): Payer: BLUE CROSS/BLUE SHIELD

## 2017-08-11 ENCOUNTER — Other Ambulatory Visit (HOSPITAL_COMMUNITY): Payer: BLUE CROSS/BLUE SHIELD

## 2017-08-12 ENCOUNTER — Other Ambulatory Visit (HOSPITAL_COMMUNITY): Payer: BLUE CROSS/BLUE SHIELD

## 2017-08-13 ENCOUNTER — Other Ambulatory Visit (HOSPITAL_COMMUNITY): Payer: BLUE CROSS/BLUE SHIELD

## 2017-08-14 ENCOUNTER — Other Ambulatory Visit (HOSPITAL_COMMUNITY): Payer: BLUE CROSS/BLUE SHIELD

## 2017-08-18 ENCOUNTER — Other Ambulatory Visit (HOSPITAL_COMMUNITY): Payer: BLUE CROSS/BLUE SHIELD

## 2017-08-19 ENCOUNTER — Other Ambulatory Visit (HOSPITAL_COMMUNITY): Payer: BLUE CROSS/BLUE SHIELD

## 2017-08-20 ENCOUNTER — Other Ambulatory Visit (HOSPITAL_COMMUNITY): Payer: BLUE CROSS/BLUE SHIELD

## 2017-08-21 ENCOUNTER — Other Ambulatory Visit (HOSPITAL_COMMUNITY): Payer: BLUE CROSS/BLUE SHIELD

## 2018-08-04 ENCOUNTER — Other Ambulatory Visit: Payer: Self-pay | Admitting: Internal Medicine

## 2018-08-04 DIAGNOSIS — Z1231 Encounter for screening mammogram for malignant neoplasm of breast: Secondary | ICD-10-CM

## 2018-10-01 DIAGNOSIS — Z0000 Encounter for general adult medical examination without abnormal findings: Secondary | ICD-10-CM | POA: Diagnosis not present

## 2018-10-01 DIAGNOSIS — E559 Vitamin D deficiency, unspecified: Secondary | ICD-10-CM | POA: Diagnosis not present

## 2018-10-05 DIAGNOSIS — E039 Hypothyroidism, unspecified: Secondary | ICD-10-CM | POA: Diagnosis not present

## 2018-10-05 DIAGNOSIS — Z0000 Encounter for general adult medical examination without abnormal findings: Secondary | ICD-10-CM | POA: Diagnosis not present

## 2018-10-05 DIAGNOSIS — E538 Deficiency of other specified B group vitamins: Secondary | ICD-10-CM | POA: Diagnosis not present

## 2018-11-19 DIAGNOSIS — Z0000 Encounter for general adult medical examination without abnormal findings: Secondary | ICD-10-CM | POA: Diagnosis not present

## 2019-03-12 IMAGING — MG MM DIGITAL SCREENING BILAT W/ TOMO W/ CAD
8 of 13 series · 8 of 29 positions shown · non-contrast
Comparison: Previous exam(s).

CLINICAL DATA: Screening.

EXAM:
2D DIGITAL SCREENING BILATERAL MAMMOGRAM WITH CAD AND ADJUNCT TOMO

[R MLO synth-2D]
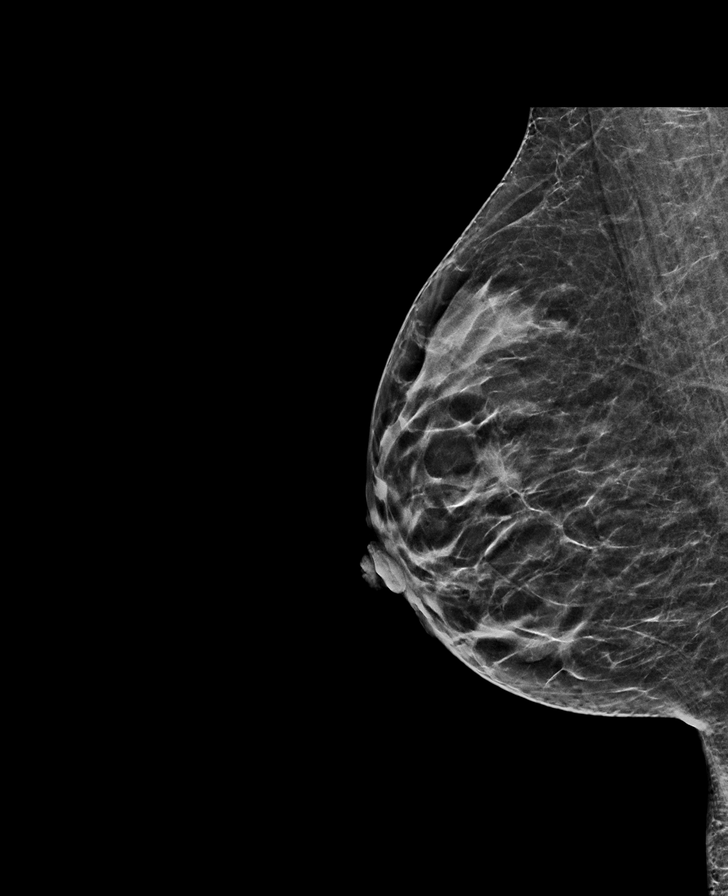

[R CC synth-2D]
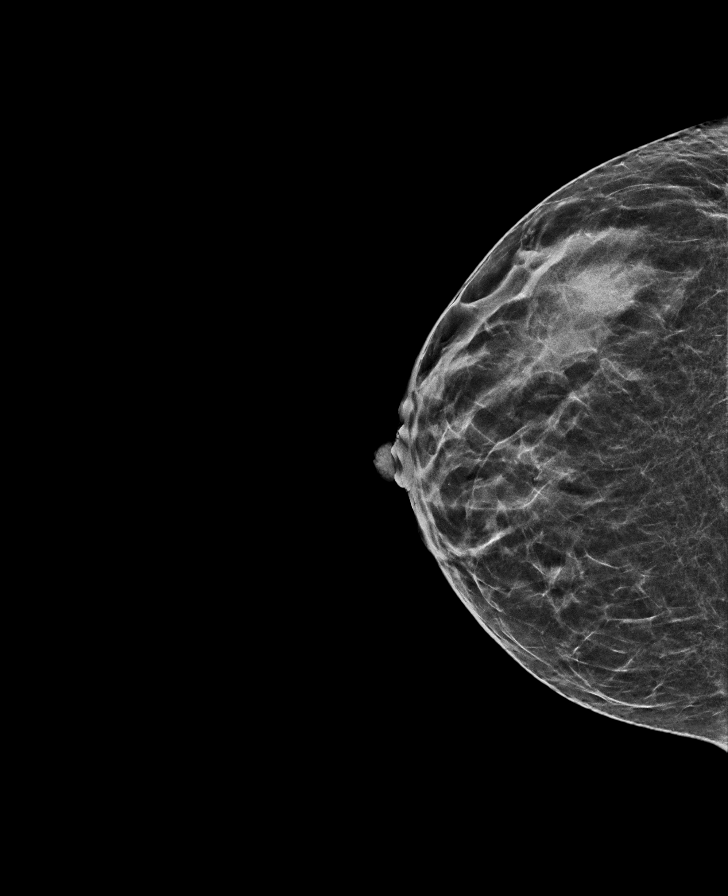

[L MLO]
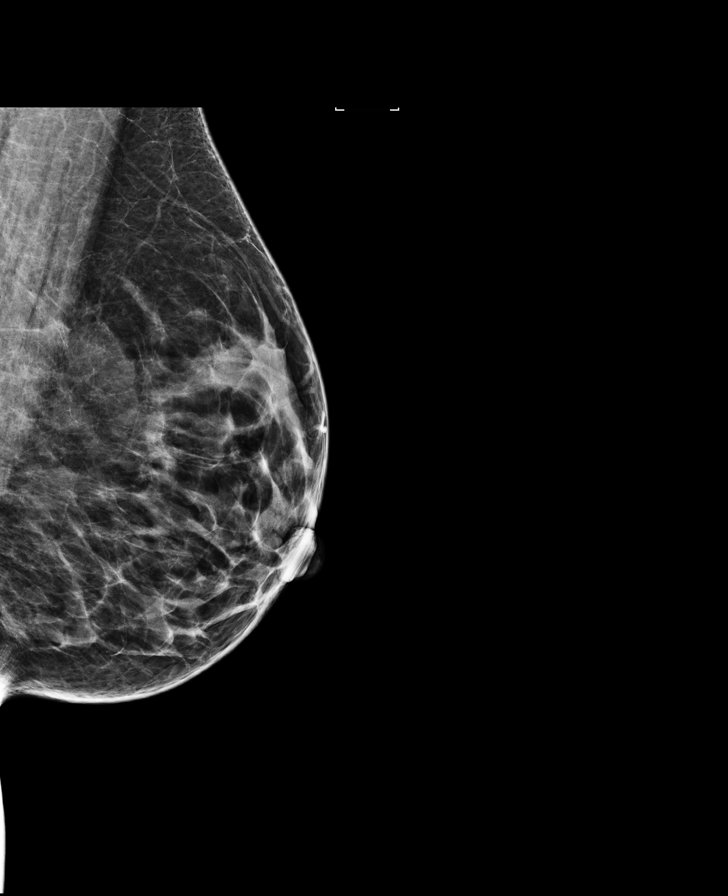

[L MLO synth-2D]
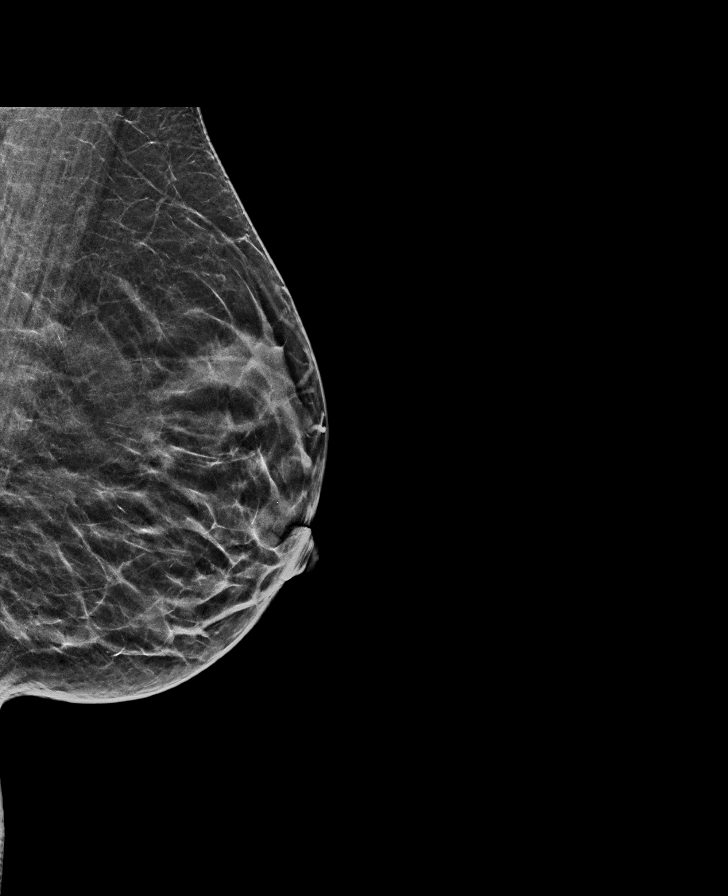

[L CC synth-2D]
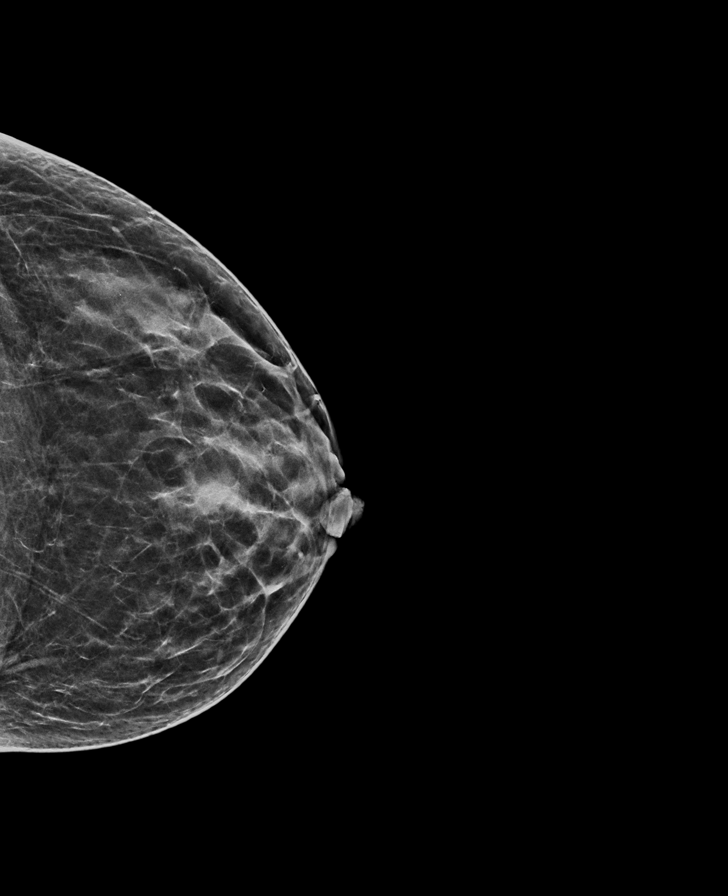

[R MLO]
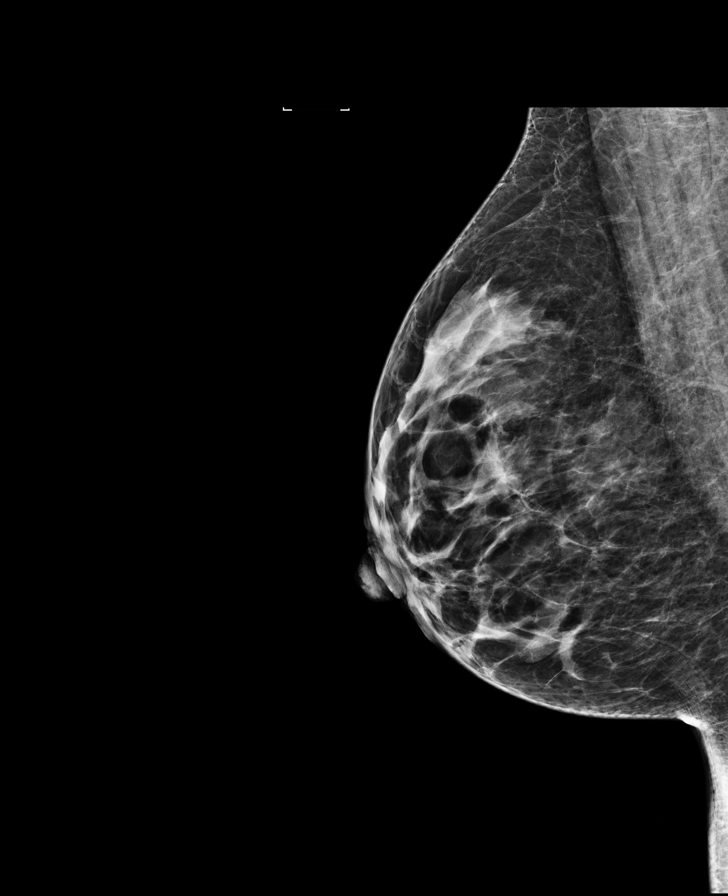

[R CC (1 of 2)]
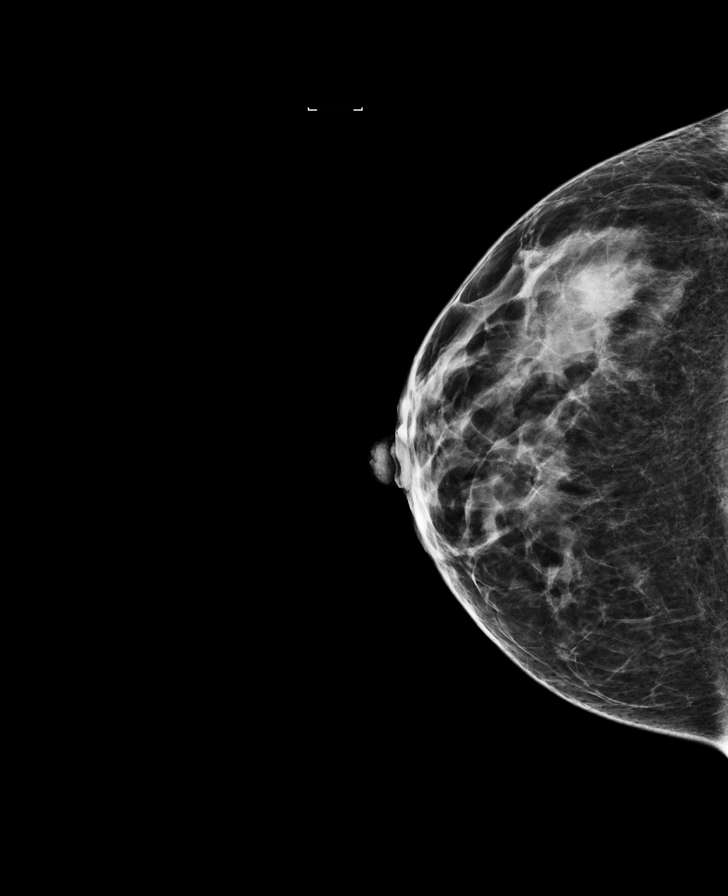

[R CC (2 of 2)]
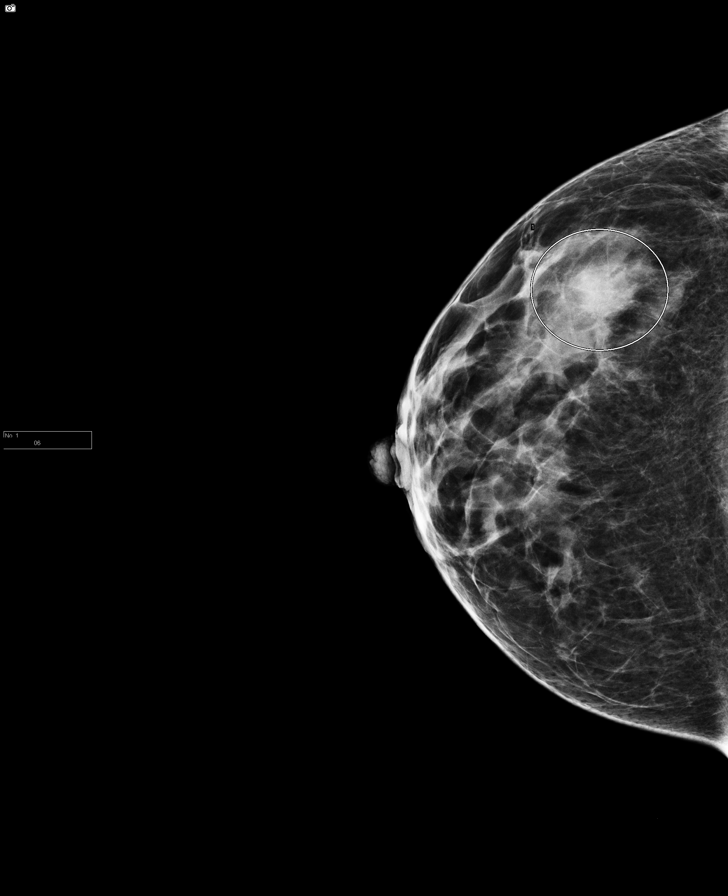

[8 of 29 positions shown; findings below may reference images not displayed]

ACR Breast Density Category c: The breast tissue is heterogeneously
dense, which may obscure small masses.
FINDINGS: In the right breast, possible asymmetries warrant further
evaluation. In the left breast, no findings suspicious for
malignancy. Images were processed with CAD.
IMPRESSION: Further evaluation is suggested for possible asymmetries in the
right breast.

RECOMMENDATION:
Diagnostic mammogram and possibly ultrasound of the right breast.
(Code:[id number])

The patient will be contacted regarding the findings, and additional
imaging will be scheduled.

BI-RADS CATEGORY  0: Incomplete. Need additional imaging evaluation
and/or prior mammograms for comparison.

## 2019-09-30 ENCOUNTER — Other Ambulatory Visit: Payer: Self-pay | Admitting: Internal Medicine

## 2019-09-30 DIAGNOSIS — Z1231 Encounter for screening mammogram for malignant neoplasm of breast: Secondary | ICD-10-CM

## 2020-01-12 ENCOUNTER — Other Ambulatory Visit: Payer: Self-pay | Admitting: Internal Medicine

## 2020-01-12 ENCOUNTER — Ambulatory Visit
Admission: RE | Admit: 2020-01-12 | Discharge: 2020-01-12 | Disposition: A | Discharge status: Home / Self Care | Source: Ambulatory Visit | Attending: Internal Medicine | Admitting: Internal Medicine

## 2020-01-12 DIAGNOSIS — Z1231 Encounter for screening mammogram for malignant neoplasm of breast: Secondary | ICD-10-CM | POA: Insufficient documentation

## 2020-02-25 ENCOUNTER — Ambulatory Visit: Payer: Self-pay | Attending: Internal Medicine

## 2020-02-25 DIAGNOSIS — Z23 Encounter for immunization: Secondary | ICD-10-CM

## 2020-02-25 NOTE — Progress Notes (Signed)
   Covid-19 Vaccination Clinic  Name:  Debbie Kim    MRN: [id number] DOB: 1976-08-01  02/25/2020  [name] was observed post Covid-19 immunization for 15 minutes without incident. She was provided with Vaccine Information Sheet and instruction to access the V-Safe system.   [name] was instructed to call 911 with any severe reactions post vaccine: Marland Kitchen Difficulty breathing  . Swelling of face and throat  . A fast heartbeat  . A bad rash all over body  . Dizziness and weakness   Immunizations Administered    Name Date Dose VIS Date Route   Pfizer COVID-19 Vaccine 02/25/2020  1:38 PM 0.3 mL 11/25/2019 Intramuscular   Manufacturer: [employer], [employer]   Lot: [id number]   NDC: [address]-7

## 2020-03-27 ENCOUNTER — Ambulatory Visit: Attending: Internal Medicine

## 2020-03-27 DIAGNOSIS — Z23 Encounter for immunization: Secondary | ICD-10-CM

## 2020-03-27 NOTE — Progress Notes (Signed)
   Covid-19 Vaccination Clinic  Name:  Debbie Kim    MRN: [id number] DOB: 1976-10-22  03/27/2020  [name] was observed post Covid-19 immunization for 15 minutes without incident. She was provided with Vaccine Information Sheet and instruction to access the V-Safe system.   [name] was instructed to call 911 with any severe reactions post vaccine: Marland Kitchen Difficulty breathing  . Swelling of face and throat  . A fast heartbeat  . A bad rash all over body  . Dizziness and weakness   Immunizations Administered    Name Date Dose VIS Date Route   Pfizer COVID-19 Vaccine 03/27/2020  8:58 AM 0.3 mL 11/25/2019 Intramuscular   Manufacturer: [employer], [employer]   Lot: [id number]   NDC: [address]-0

## 2021-02-26 ENCOUNTER — Other Ambulatory Visit: Payer: Self-pay | Admitting: Internal Medicine

## 2021-02-26 DIAGNOSIS — Z1231 Encounter for screening mammogram for malignant neoplasm of breast: Secondary | ICD-10-CM

## 2021-03-05 ENCOUNTER — Ambulatory Visit
Admission: RE | Admit: 2021-03-05 | Discharge: 2021-03-05 | Disposition: A | Discharge status: Home / Self Care | Source: Ambulatory Visit | Attending: Internal Medicine | Admitting: Internal Medicine

## 2021-03-05 ENCOUNTER — Other Ambulatory Visit: Payer: Self-pay

## 2021-03-05 DIAGNOSIS — Z1231 Encounter for screening mammogram for malignant neoplasm of breast: Secondary | ICD-10-CM | POA: Diagnosis not present

## 2022-05-02 ENCOUNTER — Other Ambulatory Visit: Payer: Self-pay | Admitting: Internal Medicine

## 2022-05-02 DIAGNOSIS — Z1231 Encounter for screening mammogram for malignant neoplasm of breast: Secondary | ICD-10-CM

## 2022-06-04 ENCOUNTER — Ambulatory Visit
Admission: RE | Admit: 2022-06-04 | Discharge: 2022-06-04 | Disposition: A | Discharge status: Home / Self Care | Source: Ambulatory Visit | Attending: Internal Medicine | Admitting: Internal Medicine

## 2022-06-04 DIAGNOSIS — Z1231 Encounter for screening mammogram for malignant neoplasm of breast: Secondary | ICD-10-CM | POA: Insufficient documentation

## 2022-06-30 ENCOUNTER — Other Ambulatory Visit: Payer: Self-pay | Admitting: Physician Assistant

## 2022-06-30 DIAGNOSIS — M545 Low back pain, unspecified: Secondary | ICD-10-CM

## 2022-07-09 ENCOUNTER — Ambulatory Visit
Admission: RE | Admit: 2022-07-09 | Discharge: 2022-07-09 | Disposition: A | Discharge status: Home / Self Care | Source: Ambulatory Visit | Attending: Physician Assistant | Admitting: Physician Assistant

## 2022-07-09 DIAGNOSIS — G8929 Other chronic pain: Secondary | ICD-10-CM | POA: Diagnosis present

## 2022-07-09 DIAGNOSIS — M545 Low back pain, unspecified: Secondary | ICD-10-CM | POA: Diagnosis present

## 2023-05-15 ENCOUNTER — Other Ambulatory Visit: Payer: Self-pay | Admitting: Internal Medicine

## 2023-05-15 DIAGNOSIS — Z1231 Encounter for screening mammogram for malignant neoplasm of breast: Secondary | ICD-10-CM

## 2023-06-11 ENCOUNTER — Inpatient Hospital Stay: Admission: RE | Admit: 2023-06-11 | Source: Ambulatory Visit

## 2023-09-14 ENCOUNTER — Ambulatory Visit
Admission: RE | Admit: 2023-09-14 | Discharge: 2023-09-14 | Disposition: A | Discharge status: Home / Self Care | Source: Ambulatory Visit | Attending: Internal Medicine | Admitting: Internal Medicine

## 2023-09-14 DIAGNOSIS — Z1231 Encounter for screening mammogram for malignant neoplasm of breast: Secondary | ICD-10-CM | POA: Insufficient documentation

## 2024-05-27 ENCOUNTER — Other Ambulatory Visit: Payer: Self-pay | Admitting: Emergency Medicine

## 2024-05-27 DIAGNOSIS — F17210 Nicotine dependence, cigarettes, uncomplicated: Secondary | ICD-10-CM

## 2024-06-16 ENCOUNTER — Ambulatory Visit
Admission: RE | Admit: 2024-06-16 | Discharge: 2024-06-16 | Disposition: A | Discharge status: Home / Self Care | Source: Ambulatory Visit | Attending: Emergency Medicine | Admitting: Emergency Medicine

## 2024-06-16 DIAGNOSIS — F17210 Nicotine dependence, cigarettes, uncomplicated: Secondary | ICD-10-CM | POA: Diagnosis present

## 2024-09-06 ENCOUNTER — Other Ambulatory Visit: Payer: Self-pay | Admitting: Emergency Medicine

## 2024-09-06 DIAGNOSIS — F17210 Nicotine dependence, cigarettes, uncomplicated: Secondary | ICD-10-CM

## 2024-09-06 DIAGNOSIS — R911 Solitary pulmonary nodule: Secondary | ICD-10-CM

## 2024-09-15 ENCOUNTER — Ambulatory Visit
Admission: RE | Admit: 2024-09-15 | Discharge: 2024-09-15 | Disposition: A | Discharge status: Home / Self Care | Source: Ambulatory Visit | Attending: Emergency Medicine | Admitting: Emergency Medicine

## 2024-09-15 DIAGNOSIS — R911 Solitary pulmonary nodule: Secondary | ICD-10-CM | POA: Diagnosis present

## 2024-09-15 DIAGNOSIS — F17210 Nicotine dependence, cigarettes, uncomplicated: Secondary | ICD-10-CM | POA: Insufficient documentation

## 2024-10-27 ENCOUNTER — Other Ambulatory Visit: Payer: Self-pay | Admitting: Internal Medicine

## 2024-10-27 DIAGNOSIS — Z1231 Encounter for screening mammogram for malignant neoplasm of breast: Secondary | ICD-10-CM

## 2024-12-01 ENCOUNTER — Ambulatory Visit

## 2025-01-04 ENCOUNTER — Ambulatory Visit
Admission: RE | Admit: 2025-01-04 | Discharge: 2025-01-04 | Disposition: A | Discharge status: Home / Self Care | Source: Ambulatory Visit | Attending: Internal Medicine | Admitting: Internal Medicine

## 2025-01-04 DIAGNOSIS — Z1231 Encounter for screening mammogram for malignant neoplasm of breast: Secondary | ICD-10-CM | POA: Diagnosis present
